# Patient Record
Sex: Female | Born: 1960 | Race: White | Hispanic: No | Marital: Single | Smoking: Former smoker
Health system: Southern US, Community
[De-identification: ages and names within clinical notes are randomized; demographics above are authoritative.]

## PROBLEM LIST (undated history)

## (undated) DIAGNOSIS — R1310 Dysphagia, unspecified: Secondary | ICD-10-CM

## (undated) DIAGNOSIS — R059 Cough, unspecified: Secondary | ICD-10-CM

## (undated) DIAGNOSIS — R0981 Nasal congestion: Secondary | ICD-10-CM

## (undated) DIAGNOSIS — R05 Cough: Secondary | ICD-10-CM

## (undated) HISTORY — DX: Cough, unspecified: R05.9

## (undated) HISTORY — PX: PARTIAL HYSTERECTOMY: SHX80

## (undated) HISTORY — DX: Nasal congestion: R09.81

## (undated) HISTORY — DX: Cough: R05

## (undated) HISTORY — DX: Dysphagia, unspecified: R13.10

---

## 1988-07-30 HISTORY — PX: AUGMENTATION MAMMAPLASTY: SUR837

## 2005-07-30 HISTORY — PX: FOOT SURGERY: SHX648

## 2006-09-13 ENCOUNTER — Ambulatory Visit: Payer: Self-pay | Admitting: Podiatry

## 2008-11-15 ENCOUNTER — Encounter: Payer: Self-pay | Admitting: Family Medicine

## 2009-05-05 ENCOUNTER — Inpatient Hospital Stay (HOSPITAL_COMMUNITY): Admission: EM | Admit: 2009-05-05 | Discharge: 2009-05-09 | Payer: Self-pay | Admitting: Emergency Medicine

## 2010-07-28 ENCOUNTER — Encounter: Payer: Self-pay | Admitting: Family Medicine

## 2010-07-28 ENCOUNTER — Ambulatory Visit
Admission: RE | Admit: 2010-07-28 | Discharge: 2010-07-28 | Payer: Self-pay | Source: Home / Self Care | Attending: Family Medicine | Admitting: Family Medicine

## 2010-07-28 DIAGNOSIS — D3910 Neoplasm of uncertain behavior of unspecified ovary: Secondary | ICD-10-CM

## 2010-07-28 DIAGNOSIS — M79609 Pain in unspecified limb: Secondary | ICD-10-CM

## 2010-07-28 DIAGNOSIS — D485 Neoplasm of uncertain behavior of skin: Secondary | ICD-10-CM | POA: Insufficient documentation

## 2010-07-28 DIAGNOSIS — H5310 Unspecified subjective visual disturbances: Secondary | ICD-10-CM | POA: Insufficient documentation

## 2010-07-28 DIAGNOSIS — F411 Generalized anxiety disorder: Secondary | ICD-10-CM | POA: Insufficient documentation

## 2010-08-01 ENCOUNTER — Telehealth: Payer: Self-pay | Admitting: Family Medicine

## 2010-08-01 ENCOUNTER — Telehealth (INDEPENDENT_AMBULATORY_CARE_PROVIDER_SITE_OTHER): Payer: Self-pay | Admitting: *Deleted

## 2010-08-01 LAB — CONVERTED CEMR LAB
ALT: 16 units/L (ref 0–35)
BUN: 15 mg/dL (ref 6–23)
CO2: 30 meq/L (ref 19–32)
Chloride: 104 meq/L (ref 96–112)
Direct LDL: 141 mg/dL
Eosinophils Relative: 3.6 % (ref 0.0–5.0)
Glucose, Bld: 74 mg/dL (ref 70–99)
HCT: 42.4 % (ref 36.0–46.0)
Lymphs Abs: 1.3 10*3/uL (ref 0.7–4.0)
MCV: 93.9 fL (ref 78.0–100.0)
Monocytes Absolute: 0.4 10*3/uL (ref 0.1–1.0)
Platelets: 236 10*3/uL (ref 150.0–400.0)
Potassium: 4.3 meq/L (ref 3.5–5.1)
RDW: 12.6 % (ref 11.5–14.6)
TSH: 1.57 microintl units/mL (ref 0.35–5.50)
Total Bilirubin: 0.9 mg/dL (ref 0.3–1.2)
WBC: 5.3 10*3/uL (ref 4.5–10.5)

## 2010-08-02 ENCOUNTER — Encounter (INDEPENDENT_AMBULATORY_CARE_PROVIDER_SITE_OTHER): Payer: Self-pay | Admitting: *Deleted

## 2010-08-02 DIAGNOSIS — J45909 Unspecified asthma, uncomplicated: Secondary | ICD-10-CM | POA: Insufficient documentation

## 2010-08-02 DIAGNOSIS — N393 Stress incontinence (female) (male): Secondary | ICD-10-CM | POA: Insufficient documentation

## 2010-08-02 DIAGNOSIS — J309 Allergic rhinitis, unspecified: Secondary | ICD-10-CM | POA: Insufficient documentation

## 2010-08-04 ENCOUNTER — Ambulatory Visit
Admission: RE | Admit: 2010-08-04 | Discharge: 2010-08-04 | Payer: Self-pay | Source: Home / Self Care | Attending: Family Medicine | Admitting: Family Medicine

## 2010-08-04 DIAGNOSIS — M7750 Other enthesopathy of unspecified foot: Secondary | ICD-10-CM | POA: Insufficient documentation

## 2010-08-04 DIAGNOSIS — M21619 Bunion of unspecified foot: Secondary | ICD-10-CM | POA: Insufficient documentation

## 2010-08-31 NOTE — Progress Notes (Signed)
Summary: lab  Phone Note Outgoing Call   Call placed by: Doristine Devoid CMA,  August 01, 2010 4:13 PM Call placed to: Patient Summary of Call: total cholesterol and LDL are both elevated.  there are 2 choices- work on diet and exercise for 6 months and recheck labs or start simvastatin 20mg  at bedtime and recheck LFTs in 6-8 weeks.  rest of labs look good!  vit d level is low.  needs to start 50,000 units weekly x12 weeks and then recheck  Follow-up for Phone Call        tried calling patient unable to leave msg voicemail box is full will try later.........Marland KitchenDoristine Devoid CMA  August 01, 2010 4:14 PM   Additional Follow-up for Phone Call Additional follow up Details #1::        Patient called in and said she would like to hold off on the simvastatin for now and focus on diet. [facility]. Additional Follow-up by: Harold Barban,  August 07, 2010 11:46 AM    New/Updated Medications: VITAMIN D (ERGOCALCIFEROL) 50000 UNIT CAPS (ERGOCALCIFEROL) take one tablet weekly x12 weeks Prescriptions: VITAMIN D (ERGOCALCIFEROL) 50000 UNIT CAPS (ERGOCALCIFEROL) take one tablet weekly x12 weeks  #12 x 0   Entered by:   Doristine Devoid CMA   Authorized by:   Neena Rhymes MD   Signed by:   Doristine Devoid CMA on 08/07/2010   Method used:   [facility]        [phone][facility] (retail)       [address]       [place], [place]  [address]       Ph: [phone]       Fax: [phone]   RxID:   [id number]

## 2010-08-31 NOTE — Letter (Signed)
Summary: Primary Care Consult Scheduled Letter  [facility] [place]  [address] [place], [place] [address]   Phone: [phone]  Fax: [phone]      08/02/2010 MRN: [id number]  [name] [address] [place], [place]  [address]    Dear Ms. [place],    We have scheduled an appointment for you.  At the recommendation of Dr. Neena Rhymes, we have scheduled you a consult with Dr. June Leap of [facility] on 08-14-2010 at 9:30am.  Their address is [address], [place] [address]. The office phone number is [phone].  If this appointment day and time is not convenient for you, please feel free to call the office of the doctor you are being referred to at the number listed above and reschedule the appointment.    It is important for you to keep your scheduled appointments. We are here to make sure you are given good patient care.   Thank you,    Renee, Patient Care Coordinator [place]

## 2010-08-31 NOTE — Progress Notes (Signed)
Summary: ophthalmology  ---- Converted from flag ---- ---- 08/01/2010 1:41 PM, Neena Rhymes MD wrote: just age related vision change.  routine eye exam  ---- 08/01/2010 1:17 PM, Magdalen Spatz [facility] wrote: Dr. Beverely Low,  Ref Ophthalmology, [facility] needs more info about the Visual Changes prior to scheduling please.  Thank you ------------------------------

## 2010-08-31 NOTE — Assessment & Plan Note (Signed)
Summary: LEFT FOOT PAIN/NP/LP   Vital Signs:  Patient profile:   50 year old female Height:      64 inches (162.56 cm) Weight:      120.6 pounds (54.82 kg) BMI:     20.78 Temp:     97.2 degrees F (36.22 degrees C) oral Pulse rate:   68 / minute BP sitting:   122 / 83  (left arm)  Vitals Entered By: Baxter Hire) (August 04, 2010 9:06 AM) CC: left foot pain Pain Assessment Patient in pain? yes     Location: left foot Intensity: 6 Nutritional Status BMI of 19 -24 = normal  Does patient need assistance? Functional Status Self care Ambulation Normal   Primary Care Provider:  Neena Rhymes MD  CC:  left foot pain.  History of Present Illness: 50 yo F here for left foot pain  Patient reports no known injury Over the past 1 year has developed worsening left plantar foot pain on ball of foot Has tried OTC medications, cushioned insoles. Has not tried MT pads Uses heat instead of ice because this makes it feel better Is on feet a lot at work on concrete floor which worsens this No prior problems with left foot or left foot surgeries (bunionectomy on right) Not physically active outside of work - no running. No prior h/o stress fractures.  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 Take One Tablet Two Times A Day As Needed  Allergies (verified): No Known Drug Allergies  Family History: + DM in brother, mother, father + heart disease in brother and mother +  HTN in brother and mother  Social History: nonsmoker, social drinker works for [employer] Status:  never  Physical Exam  General:  Well-developed,well-nourished,in no acute distress; alert,appropriate and cooperative throughout examination Msk:  L foot: Wide forefoot with mod-severe transverse arch collapse Callus prominent over 3rd MT head plantar surface. Bunion as well but no tenderness or redness Cavus foot TTP greatest at 3rd MT head No pain with tuning fork testing of 3rd MT FROM toes and ankle. NVI distally   Impression & Recommendations:  Problem # 1:  FOOT PAIN (ICD-729.5) Assessment Unchanged  2/2 metatarsalgia.  Given comforthotics with metatarsal pads.  Felt less pain at MT head with these in place.  Is not physically active or a runner and exam inconsistent with stress fracture.  No need for x-rays at this time.  No pain between MT heads to suggest mortons neuroma.  f/u in 1 month for reevaluation  Orders: Sports Insoles (Z6109)  Problem # 2:  METATARSALGIA (ICD-726.70) Assessment: New See #1 above  Orders: Sports Insoles (U0454)  Problem # 3:  BUNION (ICD-727.1)  Discussed toe spacer and bunion pad - no current pain however.  Orders: Sports Insoles [id number])  Complete Medication List: 1)  Alprazolam 1 Mg Tabs (Alprazolam) .... Take one tablet two times a day as needed   Orders Added: 1)  New Patient  Level III [99203] 2)  Sports Insoles [L3510]

## 2010-08-31 NOTE — Letter (Signed)
Summary: Sharin Grave MD  Sharin Grave MD   Imported By: Lanelle Bal 08/08/2010 12:28:16  _____________________________________________________________________  External Attachment:    Type:   Image     Comment:   External Document
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
## 2010-09-11 ENCOUNTER — Ambulatory Visit: Payer: Self-pay | Admitting: Family Medicine

## 2010-11-02 LAB — CBC
HCT: 37.4 % (ref 36.0–46.0)
Hemoglobin: 12.4 g/dL (ref 12.0–15.0)
Platelets: 203 10*3/uL (ref 150–400)
RBC: 3.87 MIL/uL (ref 3.87–5.11)
RDW: 12.9 % (ref 11.5–15.5)
WBC: 13.6 10*3/uL — ABNORMAL HIGH (ref 4.0–10.5)
WBC: 7 10*3/uL (ref 4.0–10.5)

## 2010-11-02 LAB — HEMOGLOBIN AND HEMATOCRIT, BLOOD
HCT: 35.5 % — ABNORMAL LOW (ref 36.0–46.0)
HCT: 37.9 % (ref 36.0–46.0)
HCT: 38 % (ref 36.0–46.0)
Hemoglobin: 12.9 g/dL (ref 12.0–15.0)

## 2010-11-02 LAB — BASIC METABOLIC PANEL
BUN: 5 mg/dL — ABNORMAL LOW (ref 6–23)
Chloride: 105 mEq/L (ref 96–112)
Chloride: 106 mEq/L (ref 96–112)
Creatinine, Ser: 0.65 mg/dL (ref 0.4–1.2)
Creatinine, Ser: 0.7 mg/dL (ref 0.4–1.2)
GFR calc Af Amer: >60 mL/min (ref >60)
GFR calc non Af Amer: >60 mL/min (ref >60)
Potassium: 4.2 mEq/L (ref 3.5–5.1)
Sodium: 136 mEq/L (ref 135–145)

## 2010-11-02 LAB — DIFFERENTIAL
Basophils Absolute: 0 10*3/uL (ref 0.0–0.1)
Eosinophils Absolute: 0.1 10*3/uL (ref 0.0–0.7)
Lymphocytes Relative: 7 % — ABNORMAL LOW (ref 12–46)
Monocytes Relative: 8 % (ref 3–12)
Neutro Abs: 11.4 10*3/uL — ABNORMAL HIGH (ref 1.7–7.7)
Neutrophils Relative %: 84 % — ABNORMAL HIGH (ref 43–77)

## 2010-11-02 LAB — URINE MICROSCOPIC-ADD ON

## 2010-11-02 LAB — TYPE AND SCREEN
ABO/RH(D): A NEG
Antibody Screen: NEGATIVE

## 2010-11-02 LAB — URINALYSIS, ROUTINE W REFLEX MICROSCOPIC
Bilirubin Urine: NEGATIVE
Ketones, ur: NEGATIVE mg/dL
Leukocytes, UA: NEGATIVE
Nitrite: NEGATIVE

## 2010-11-02 LAB — PREGNANCY, URINE: Preg Test, Ur: NEGATIVE

## 2010-11-20 ENCOUNTER — Encounter: Payer: Self-pay | Admitting: *Deleted

## 2010-12-22 ENCOUNTER — Telehealth: Payer: Self-pay | Admitting: Family Medicine

## 2010-12-22 MED ORDER — ALPRAZOLAM 1 MG PO TABS: ORAL_TABLET | ORAL | Status: DC

## 2010-12-22 NOTE — Telephone Encounter (Signed)
Last OV 08-04-10

## 2010-12-22 NOTE — Telephone Encounter (Signed)
rx faxed

## 2010-12-22 NOTE — Telephone Encounter (Signed)
Ok for #60, no refills 

## 2011-05-28 ENCOUNTER — Other Ambulatory Visit: Payer: Self-pay | Admitting: Family Medicine

## 2011-05-28 MED ORDER — ALPRAZOLAM 1 MG PO TABS: ORAL_TABLET | ORAL | Status: DC

## 2011-05-28 NOTE — Telephone Encounter (Signed)
Patient request refill alprazolam 1mg  - [facility] [address] - last OV 08-04-10 however it was abstracted on 11-20-10 and I do not have a [employer] login to see if they were here later??? Last refill was 12-22-10

## 2011-05-28 NOTE — Telephone Encounter (Signed)
Patient request refill alprazolam 1mg  - [facility] [address]

## 2011-05-28 NOTE — Telephone Encounter (Signed)
Ok for #30, no refills.  Please remind her to schedule CPE after 12/30.

## 2011-05-29 ENCOUNTER — Other Ambulatory Visit: Payer: Self-pay | Admitting: *Deleted

## 2011-05-29 NOTE — Telephone Encounter (Signed)
Patient called re refill - patient will check her schedule & schedule cpx

## 2011-05-29 NOTE — Telephone Encounter (Signed)
Faxed script for alprazolam manually [facility] [place] [facility] [address]

## 2011-06-19 ENCOUNTER — Telehealth: Payer: Self-pay | Admitting: Family Medicine

## 2011-06-19 MED ORDER — ALPRAZOLAM 1 MG PO TABS: ORAL_TABLET | ORAL | Status: DC

## 2011-06-19 NOTE — Telephone Encounter (Signed)
Manually [facility]ed [facility]

## 2011-06-19 NOTE — Telephone Encounter (Signed)
Refill x1 

## 2011-06-19 NOTE — Telephone Encounter (Signed)
Last OV 08-31-10 Last Refill 05-28-11

## 2011-06-19 NOTE — Telephone Encounter (Signed)
Refill alprazolam - [facility] [address]

## 2011-07-20 ENCOUNTER — Other Ambulatory Visit: Payer: Self-pay | Admitting: Family Medicine

## 2011-07-20 MED ORDER — ALPRAZOLAM 1 MG PO TABS: ORAL_TABLET | ORAL | Status: DC

## 2011-07-20 NOTE — Telephone Encounter (Signed)
Last OV 08-04-10 upcoming 08-20-11 last refill 06-19-11 #30 no refills

## 2011-07-20 NOTE — Telephone Encounter (Signed)
.  rx faxed to pharmacy, manually.  

## 2011-07-20 NOTE — Telephone Encounter (Signed)
Ok for #30, no refills 

## 2011-08-20 ENCOUNTER — Encounter: Payer: Self-pay | Admitting: Family Medicine

## 2011-08-21 ENCOUNTER — Encounter (INDEPENDENT_AMBULATORY_CARE_PROVIDER_SITE_OTHER): Payer: Self-pay | Admitting: Surgery

## 2011-08-23 ENCOUNTER — Encounter (INDEPENDENT_AMBULATORY_CARE_PROVIDER_SITE_OTHER): Payer: Self-pay | Admitting: Surgery

## 2011-08-23 ENCOUNTER — Ambulatory Visit (INDEPENDENT_AMBULATORY_CARE_PROVIDER_SITE_OTHER): Payer: 59 | Admitting: Surgery

## 2011-08-23 DIAGNOSIS — R1310 Dysphagia, unspecified: Secondary | ICD-10-CM | POA: Insufficient documentation

## 2011-08-23 NOTE — Progress Notes (Signed)
Chief Complaint  Patient presents with  . New Evaluation    Eval thyroid - referral from Julio Sicks, NP, [facility]   HISTORY: Patient is a 51 year old white female referred by her gynecologist for evaluation of dysphagia. Patient noted choking sensation frequently with meals beginning approximately 1-1/2 years ago. She has developed significant dysphagia even with small solid food. She has a constant globus sensation in the throat. She notes a chronic cough.  Patient was seen by her gynecologist. On physical exam she was felt to have possible thyroid nodules and was referred to our office for evaluation. Patient has had no prior head or neck surgery. She has no history of endocrinopathy. There is no family history of thyroid disease.  Thyroid function test were performed at the office of the gynecologist and show a normal TSH of 1.613. No other diagnostic studies were ordered.  Past Medical History  Diagnosis Date  . Trouble swallowing   . Cough   . Nasal congestion      Current Outpatient Prescriptions  Medication Sig Dispense Refill  . busPIRone (BUSPAR) 5 MG tablet Take 5 mg by mouth as needed.      . ALPRAZolam (XANAX) 1 MG tablet Two times a day as needed  30 tablet  0  . Vitamin D, Ergocalciferol, (DRISDOL) 50000 UNITS CAPS Take 50,000 Units by mouth. Weekly x 12 weeks.          No Known Allergies   Family History  Problem Relation Age of Onset  . Ulcers Father   . Gallbladder disease Mother   . Hypertension Mother   . Cancer Paternal Grandmother     lung     History   Social History  . Marital Status: Single    Spouse Name: N/A    Number of Children: N/A  . Years of Education: N/A   Social History Main Topics  . Smoking status: Former Smoker    Quit date: 08/22/1982  . Smokeless tobacco: Never Used  . Alcohol Use: Yes     occasional  . Drug Use: No  . Sexually Active: None   Other Topics Concern  . None   Social History  Narrative  . None     REVIEW OF SYSTEMS - PERTINENT POSITIVES ONLY: Constant globus sensation. Frequent dysphagia. Frequent choking sensation.  EXAM: Filed Vitals:   08/23/11 1418  BP: 110/82  Pulse: 66  Temp: 97.4 F (36.3 C)  Resp: 16    HEENT: normocephalic; pupils equal and reactive; sclerae clear; dentition good; mucous membranes moist NECK:  Mild nodularity on left; no dominant nodules; no goiter; symmetric on extension; no palpable anterior or posterior cervical lymphadenopathy; no supraclavicular masses; no tenderness CHEST: clear to auscultation bilaterally without rales, rhonchi, or wheezes CARDIAC: regular rate and rhythm without significant murmur; peripheral pulses are full ABDOMEN: soft without distension; bowel sounds present; no mass; no hepatosplenomegaly; no hernia EXT:  non-tender without edema; no deformity NEURO: no gross focal deficits; no sign of tremor   LABORATORY RESULTS: See [facility] (CHL-Epic) for most recent results   RADIOLOGY RESULTS: See [facility] (CHL-Epic) for most recent results   IMPRESSION: #1 slight nodularity thyroid gland on physical exam #2 significant dysphagia #3 globus sensation  PLAN: The patient I reviewed the above information. We discussed the possible causes of dysphagia, choking sensation, and globus sensation. Based on her exam, the thyroid is relatively normal without dominant mass. Certainly there is not a large enough mass to cause  choking or significant dysphagia. To be complete I am going to obtain a thyroid ultrasound to evaluate for nodularity felt in the left thyroid lobe. I will contact the patient with the results of that study.  As for her dysphagia and globus sensation, the most likely etiology is silent gastroesophageal reflux. In order to evaluate this further however, she may require either a swallowing study performed by speech pathology at the hospital, or she may require referral to  otolaryngology.  I would recommend Dr. Osborn Coho.  I will notify the patient and forward the results of her thyroid ultrasound.  Velora Heckler, MD, FACS General & Endocrine Surgery [facility]   Visit Diagnoses: 1. Dysphagia     Primary Care Physician: Meriel Pica, MD, MD Julio Sicks, NP

## 2011-08-23 NOTE — Patient Instructions (Signed)
Will call with ultrasound results.  tmg

## 2011-08-27 ENCOUNTER — Encounter (INDEPENDENT_AMBULATORY_CARE_PROVIDER_SITE_OTHER): Payer: Self-pay

## 2011-08-27 ENCOUNTER — Other Ambulatory Visit: Payer: Self-pay

## 2012-02-19 ENCOUNTER — Telehealth: Payer: Self-pay | Admitting: *Deleted

## 2012-02-19 NOTE — Telephone Encounter (Signed)
Received form from [employer] for a concealed Handgun permit request, MD Tabori filled out form and pt OV notes were faxed with letter as well as form original will be mailed out today. Copy placed in chart to be scanned.

## 2013-10-14 ENCOUNTER — Other Ambulatory Visit: Payer: Self-pay

## 2013-10-14 DIAGNOSIS — Z1231 Encounter for screening mammogram for malignant neoplasm of breast: Secondary | ICD-10-CM

## 2013-10-30 ENCOUNTER — Ambulatory Visit
Admission: RE | Admit: 2013-10-30 | Discharge: 2013-10-30 | Disposition: A | Discharge status: Home / Self Care | Payer: 59 | Source: Ambulatory Visit

## 2013-10-30 DIAGNOSIS — Z1231 Encounter for screening mammogram for malignant neoplasm of breast: Secondary | ICD-10-CM

## 2015-09-13 ENCOUNTER — Ambulatory Visit (INDEPENDENT_AMBULATORY_CARE_PROVIDER_SITE_OTHER): Payer: Commercial Managed Care - HMO

## 2015-09-13 ENCOUNTER — Ambulatory Visit (INDEPENDENT_AMBULATORY_CARE_PROVIDER_SITE_OTHER): Payer: Commercial Managed Care - HMO | Admitting: Family Medicine

## 2015-09-13 VITALS — BP 125/80 | HR 94 | Temp 98.9°F | Resp 16 | Ht 63.0 in | Wt 126.0 lb

## 2015-09-13 DIAGNOSIS — R5381 Other malaise: Secondary | ICD-10-CM | POA: Diagnosis not present

## 2015-09-13 DIAGNOSIS — R05 Cough: Secondary | ICD-10-CM | POA: Diagnosis not present

## 2015-09-13 DIAGNOSIS — R059 Cough, unspecified: Secondary | ICD-10-CM

## 2015-09-13 LAB — POCT CBC
GRANULOCYTE PERCENT: 68.8 % (ref 37–80)
HEMATOCRIT: 41.1 % (ref 37.7–47.9)
Hemoglobin: 13.9 g/dL (ref 12.2–16.2)
LYMPH, POC: 1.4 (ref 0.6–3.4)
MCH, POC: 29.8 pg (ref 27–31.2)
MCHC: 34 g/dL (ref 31.8–35.4)
MCV: 87.7 fL (ref 80–97)
MID (CBC): 0.5 (ref 0–0.9)
MPV: 6.3 fL (ref 0–99.8)
POC GRANULOCYTE: 4.1 (ref 2–6.9)
POC LYMPH %: 23 % (ref 10–50)
POC MID %: 8.2 %M (ref 0–12)
Platelet Count, POC: 207 10*3/uL (ref 142–424)
RBC: 4.68 M/uL (ref 4.04–5.48)
RDW, POC: 12.5 %
WBC: 5.9 10*3/uL (ref 4.6–10.2)

## 2015-09-13 MED ORDER — OSELTAMIVIR PHOSPHATE 75 MG PO CAPS: 75.0000 mg | ORAL_CAPSULE | Freq: Two times a day (BID) | ORAL | Status: DC

## 2015-09-13 MED ORDER — ACETAMINOPHEN-CODEINE #3 300-30 MG PO TABS: 1.0000 | ORAL_TABLET | Freq: Four times a day (QID) | ORAL | Status: DC | PRN

## 2015-09-13 NOTE — Patient Instructions (Addendum)
Because you received an x-ray today, you will receive an invoice from [facility]. Please contact [facility] at [phone] with questions or concerns regarding your invoice. Our billing staff will not be able to assist you with those questions.  You may have the flu Use the mucinex as needed, and the tylenol with codeine as needed for cough- remember this will make ou sleepy We will also give you tamiflu to use as directed for possible flu Please seek care if you are not getting better in the next few days- Sooner if worse.

## 2015-09-13 NOTE — Progress Notes (Signed)
[facility] [address] [place] [address] [phone]  Date:  09/13/2015   Name:  Crystal Mills   DOB:  09/29/1960   MRN:  [id number]  PCP:  Margarette Asal, MD    Chief Complaint: Sore Throat; Cough; Nausea; and Headache   History of Present Illness:  Crystal Mills is a 55 y.o. very pleasant female patient who presents with the following:  Generally healthy woman here today as a new patient with complaint of illness Today is Tuesday.  Sx seemed to start when she "got choked" on Saturday evening while eating a sandwich. Food went down the wrong way and she coughed and choked for several minutes.  By that night she had a headache and felt ill She developed nausea, and now she has a painful cough, she will cough until she dry heaves or gags.  She has not noted a fever.  She did have some body aches and chills. She notes pain in her chest with cough only  She was having some vertigo yesterday- her PCP called her in some meclizine which did help but made her feel sleepy.  Vertigo is now gone  She does not have any history of CAD.   She did smoke but quit years ago Patient Active Problem List   Diagnosis Date Noted  . Dysphagia 08/23/2011  . METATARSALGIA 08/04/2010  . BUNION 08/04/2010  . ALLERGIC RHINITIS 08/02/2010  . ASTHMA 08/02/2010  . FEMALE STRESS INCONTINENCE 08/02/2010  . 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 ANXIETY 07/28/2010  . VISUAL CHANGES 07/28/2010  . FOOT PAIN 07/28/2010    Past Medical History  Diagnosis Date  . Trouble swallowing   . Cough   . Nasal congestion     Past Surgical History  Procedure Laterality Date  . Partial hysterectomy  approx 2003  . Foot surgery  2007  . Vaginal birth after cesarean section  1981, 1991  . Augmentation mammaplasty  1990    Social History  Substance Use Topics  . Smoking status: Former Smoker    Quit date:  08/22/1982  . Smokeless tobacco: Never Used  . Alcohol Use: Yes     Comment: occasional    Family History  Problem Relation Age of Onset  . Ulcers Father   . Gallbladder disease Mother   . Hypertension Mother   . Cancer Paternal Grandmother     lung    No Known Allergies  Medication list has been reviewed and updated.  Current Outpatient Prescriptions on File Prior to Visit  Medication Sig Dispense Refill  . ALPRAZolam (XANAX) 1 MG tablet Two times a day as needed (Patient not taking: Reported on 09/13/2015) 30 tablet 0  . busPIRone (BUSPAR) 5 MG tablet Take 5 mg by mouth as needed. Reported on 09/13/2015    . Vitamin D, Ergocalciferol, (DRISDOL) 50000 UNITS CAPS Take 50,000 Units by mouth. Reported on 09/13/2015     No current facility-administered medications on file prior to visit.    Review of Systems:  As per HPI- otherwise negative.   Physical Examination: Filed Vitals:   09/13/15 1410  BP: 125/80  Pulse: 94  Temp: 98.9 F (37.2 C)  Resp: 16   Filed Vitals:   09/13/15 1410  Height: 5\' 3"  (1.6 m)  Weight: 126 lb (57.153 kg)   Body mass index is 22.33 kg/(m^2). Ideal Body Weight: Weight in (lb) to have BMI =  25: 140.8  GEN: WDWN, NAD, Non-toxic, A & O x 3, slim build.    HEENT: Atraumatic, Normocephalic. Neck supple. No masses, No LAD.  Bilateral TM wnl, oropharynx normal.  PEERL,EOMI.   Ears and Nose: No external deformity. CV: RRR, No M/G/R. No JVD. No thrill. No extra heart sounds.  Able to reproduce CP by pressing on chest wall PULM: CTA B, no wheezes, crackles, rhonchi. No retractions. No resp. distress. No accessory muscle use. ABD: S, NT, ND EXTR: No c/c/e NEURO Normal gait.  PSYCH: Normally interactive. Conversant. Not depressed or anxious appearing.  Calm demeanor.   Results for orders placed or performed in visit on 09/13/15  POCT CBC  Result Value Ref Range   WBC 5.9 4.6 - 10.2 K/uL   Lymph, poc 1.4 0.6 - 3.4   POC LYMPH PERCENT 23.0 10 -  50 %L   MID (cbc) 0.5 0 - 0.9   POC MID % 8.2 0 - 12 %M   POC Granulocyte 4.1 2 - 6.9   Granulocyte percent 68.8 37 - 80 %G   RBC 4.68 4.04 - 5.48 M/uL   Hemoglobin 13.9 12.2 - 16.2 g/dL   HCT, POC 41.1 37.7 - 47.9 %   MCV 87.7 80 - 97 fL   MCH, POC 29.8 27 - 31.2 pg   MCHC 34.0 31.8 - 35.4 g/dL   RDW, POC 12.5 %   Platelet Count, POC 207 142 - 424 K/uL   MPV 6.3 0 - 99.8 fL    [facility] reading (PRIMARY) by  Dr. Lorelei Pont. CXR: possible right sided infiltrate  Dg Chest 2 View  09/13/2015  CLINICAL DATA:  Cough, painful cough EXAM: CHEST  2 VIEW COMPARISON:  05/05/2009 FINDINGS: The heart size and mediastinal contours are within normal limits. Both lungs are clear. The visualized skeletal structures are unremarkable. IMPRESSION: No active cardiopulmonary disease. Electronically Signed   By: Kathreen Devoid   On: 09/13/2015 14:43    Assessment and Plan:. Cough - Plan: POCT CBC, DG Chest 2 View, oseltamivir (TAMIFLU) 75 MG capsule, acetaminophen-codeine (TYLENOL #3) 300-30 MG tablet  Malaise - Plan: POCT CBC, DG Chest 2 View  Her CXR is negative and CBC reassuring so she does not appear to have an aspiration pneumonia.  Suspect the chocking incident may be unrelated and that she most likely has the flu as we are in the midst of a large flu outbreak.   Will start her on tamiflu and also gave rx for tylenol #3 for cough  Signed Lamar Blinks, MD

## 2015-09-19 ENCOUNTER — Telehealth: Payer: Self-pay

## 2015-09-19 NOTE — Telephone Encounter (Signed)
Patient needs FMLA forms completed for when she missed work on 09/13/15 through 09/17/15 do to the flu. Dr Lorelei Pont was her doctor so I am going to place these forms in Dr. Thompson Caul box on 09/19/15 not sure who is taking over her cases. I have filled out the paperwork it just needs a signature and date. Please return to the FMLA/Disability box at the 102 check out desk within 5-7 business days. Thanks!

## 2015-09-23 DIAGNOSIS — Z0271 Encounter for disability determination: Secondary | ICD-10-CM

## 2015-09-26 NOTE — Telephone Encounter (Signed)
Have we completed these forms yet? Today 09/16/15 is the 5th day and patient called in to check the status. Please let me know if you will need more time. Thanks!

## 2015-09-30 NOTE — Telephone Encounter (Signed)
i have not seen these forms and I did not see them in your box have they been completed? If so was I not given a copy?

## 2015-10-03 NOTE — Telephone Encounter (Signed)
Forms scanned and patient called to come pick up on 10/03/15

## 2016-06-15 ENCOUNTER — Emergency Department (HOSPITAL_COMMUNITY): Payer: Worker's Compensation

## 2016-06-15 ENCOUNTER — Emergency Department (HOSPITAL_COMMUNITY)
Admission: EM | Admit: 2016-06-15 | Discharge: 2016-06-15 | Disposition: A | Discharge status: Home / Self Care | Payer: Worker's Compensation | Attending: Emergency Medicine | Admitting: Emergency Medicine

## 2016-06-15 ENCOUNTER — Encounter (HOSPITAL_COMMUNITY): Payer: Self-pay

## 2016-06-15 DIAGNOSIS — W1789XA Other fall from one level to another, initial encounter: Secondary | ICD-10-CM | POA: Insufficient documentation

## 2016-06-15 DIAGNOSIS — S22058A Other fracture of T5-T6 vertebra, initial encounter for closed fracture: Secondary | ICD-10-CM | POA: Diagnosis not present

## 2016-06-15 DIAGNOSIS — Z85828 Personal history of other malignant neoplasm of skin: Secondary | ICD-10-CM | POA: Insufficient documentation

## 2016-06-15 DIAGNOSIS — S22000A Wedge compression fracture of unspecified thoracic vertebra, initial encounter for closed fracture: Secondary | ICD-10-CM

## 2016-06-15 DIAGNOSIS — Z87891 Personal history of nicotine dependence: Secondary | ICD-10-CM | POA: Insufficient documentation

## 2016-06-15 DIAGNOSIS — W19XXXA Unspecified fall, initial encounter: Secondary | ICD-10-CM

## 2016-06-15 DIAGNOSIS — Y990 Civilian activity done for income or pay: Secondary | ICD-10-CM | POA: Diagnosis not present

## 2016-06-15 DIAGNOSIS — Y929 Unspecified place or not applicable: Secondary | ICD-10-CM | POA: Insufficient documentation

## 2016-06-15 DIAGNOSIS — Y9389 Activity, other specified: Secondary | ICD-10-CM | POA: Insufficient documentation

## 2016-06-15 DIAGNOSIS — Z8543 Personal history of malignant neoplasm of ovary: Secondary | ICD-10-CM | POA: Insufficient documentation

## 2016-06-15 DIAGNOSIS — S299XXA Unspecified injury of thorax, initial encounter: Secondary | ICD-10-CM | POA: Diagnosis present

## 2016-06-15 DIAGNOSIS — J45909 Unspecified asthma, uncomplicated: Secondary | ICD-10-CM | POA: Insufficient documentation

## 2016-06-15 LAB — I-STAT BETA HCG BLOOD, ED (MC, WL, AP ONLY): I-stat hCG, quantitative: <5 m[IU]/mL (ref <5)

## 2016-06-15 MED ORDER — METHOCARBAMOL 500 MG PO TABS: 500.0000 mg | ORAL_TABLET | Freq: Two times a day (BID) | ORAL | 0 refills | Status: DC | PRN

## 2016-06-15 MED ORDER — MELOXICAM 7.5 MG PO TABS: 7.5000 mg | ORAL_TABLET | Freq: Every day | ORAL | 0 refills | Status: DC

## 2016-06-15 MED ORDER — TRAMADOL HCL 50 MG PO TABS: 50.0000 mg | ORAL_TABLET | Freq: Four times a day (QID) | ORAL | 0 refills | Status: DC | PRN

## 2016-06-15 MED ORDER — ONDANSETRON HCL 4 MG/2ML IJ SOLN
4.0000 mg | Freq: Once | INTRAMUSCULAR | Status: AC
  Administered 2016-06-15: 4 mg via INTRAVENOUS
  Filled 2016-06-15: qty 2

## 2016-06-15 MED ORDER — HYDROMORPHONE HCL 2 MG/ML IJ SOLN
0.5000 mg | Freq: Once | INTRAMUSCULAR | Status: AC | PRN
  Administered 2016-06-15: 0.5 mg via INTRAVENOUS
  Filled 2016-06-15: qty 1

## 2016-06-15 MED ORDER — HYDROMORPHONE HCL 2 MG/ML IJ SOLN
0.5000 mg | Freq: Once | INTRAMUSCULAR | Status: AC
  Administered 2016-06-15: 0.5 mg via INTRAVENOUS
  Filled 2016-06-15: qty 1

## 2016-06-15 MED ORDER — SODIUM CHLORIDE 0.9 % IV BOLUS (SEPSIS): 1000.0000 mL | Freq: Once | INTRAVENOUS | Status: DC

## 2016-06-15 MED ORDER — ONDANSETRON 4 MG PO TBDP
8.0000 mg | ORAL_TABLET | Freq: Once | ORAL | Status: AC
  Administered 2016-06-15: 8 mg via ORAL
  Filled 2016-06-15: qty 2

## 2016-06-15 MED ORDER — SODIUM CHLORIDE 0.9 % IV BOLUS (SEPSIS)
500.0000 mL | Freq: Once | INTRAVENOUS | Status: AC
  Administered 2016-06-15: 500 mL via INTRAVENOUS

## 2016-06-15 NOTE — ED Notes (Signed)
Bio tech representative at bedside placing patient on back brace.

## 2016-06-15 NOTE — ED Notes (Signed)
EDP at bedside  

## 2016-06-15 NOTE — ED Provider Notes (Signed)
5:00PM: Assumed care from Margarita Mail, PA-C at sign out  Crystal Mills is a 55 y.o. female presents to ED with complaint of mid-back pain s/p fall 31ft ladder. No head trauma. No  LOC. No anticoagulation therapy. T5 compression fracture. Dr. Cyndy Freeze consulted by PA Harris, recommend jewett hyperextension brace. At sign out brace application pending. Plan d/c with pain medication and OP follow up.   6:41 PM: Jewett Hyperextension brace placed. Patient pain level 4/10.  Blood pressure 129/85, pulse 78, temperature 98.5 F (36.9 C), temperature source Oral, resp. rate 18, height 5\' 4"  (1.626 m), weight 59 kg, SpO2 100 %. Episode of emesis - most likely secondary to doses of dilaudid with minimal intake. ODT zofran given.  Heart RRR. Lungs CTABL. Intact distal pulses. +BS, abdomen soft.  Discussed results and plan with patient. Rx robaxin, ultram, and mobic. Review of [place] controlled substance database shows no recent narcotic rx. Follow up with Dr. Cyndy Freeze, contact information provided. Return precautions provided. Pt voiced understanding and is agreeable.    Roxanna Mew, PA-C 06/15/16 [place], MD 06/16/16 1341

## 2016-06-15 NOTE — ED Notes (Signed)
Pt. Refusing bedpan. Pt. Wheeled in wheelchair to restroom.

## 2016-06-15 NOTE — ED Notes (Signed)
Pt. Very nauseous as she sat up. EDP made aware.

## 2016-06-15 NOTE — Progress Notes (Signed)
Orthopedic Tech Progress Note Patient Details:  Crystal Mills 1961/01/26 [id number] Brace completed by bio-tech  Patient ID: [name], female   DOB: 1961-06-05, 55 y.o.   MRN: [id number]   Braulio Bosch 06/15/2016, 5:24 PM

## 2016-06-15 NOTE — Progress Notes (Signed)
Orthopedic Tech Progress Note Patient Details:  Crystal Mills [birth year] [id number] Called bio-tech for brace order Patient ID: [name], female   DOB: 12-06-1960, 55 y.o.   MRN: [id number]   Braulio Bosch 06/15/2016, 4:30 PM

## 2016-06-15 NOTE — Discharge Instructions (Addendum)
Read the information below.  You are being prescribed mobic for mild to moderate pain. While taking, do not take other NSAIDs (ibuprofen, motrin, or aleve). I have also prescribed robaxin, a muscle relaxer, this can make you drowsy, do not drive after taking. Lastly, I have prescribed tramadol for severe pain, this can also make you drowsy, do not drive after taking.  Wear brace until seen by Dr. Cyndy Freeze. Activity as tolerated.  Please call to schedule a follow up appointment with Dr. Cyndy Freeze, contact information provided.  Use the prescribed medication as directed.  Please discuss all new medications with your pharmacist.   You may return to the Emergency Department at any time for worsening condition or any new symptoms that concern you. Return to ED if develop fever, shortness of breath, cough, numbness, weakness, loss of bowel or bladder control, or any other new/concerning symptoms.

## 2016-06-15 NOTE — ED Triage Notes (Signed)
Pt. Coming from work via [employer] for fall off ladder today. Pt. 3/4 way up ladder when it slid down with her on it. Pt. Landed in sitting position. Pt. C/o back pain up to her thoracic area. Pt. Aox4. Pt. Denies hitting her head or LOC. Pt. Ambulatory on scene.

## 2016-06-15 NOTE — ED Notes (Signed)
Contacted ortho tech about brace at this time.

## 2016-06-15 NOTE — ED Provider Notes (Signed)
[facility] DEPT Provider Note   CSN: [id number] Arrival date & time: 06/15/16  1044     History   Chief Complaint Chief Complaint  Patient presents with  . Back Pain    HPI Crystal Mills is a 55 y.o. female who presents emergency Department with chief complaint of fall and back pain. Patient was at work today when she fell off of a 6 foot, lower bladder onto the floor in a seated position directly onto her bottom. Her right leg was tangled in the bladder and she got a small abrasion. She is up-to-date on her tetanus vaccination. She complains of severe mid back pain with bilateral radiation around the ribs. She denies neck pain, back low back or tailbone pain. She is ambulatory prior to arrival. She has pain with deep breathing, but denies any numbness, tingling or weakness in the upper and lower extremities. She did not hit her head or lose consciousness.  HPI  Past Medical History:  Diagnosis Date  . Cough   . Nasal congestion   . Trouble swallowing     Patient Active Problem List   Diagnosis Date Noted  . Dysphagia 08/23/2011  . METATARSALGIA 08/04/2010  . BUNION 08/04/2010  . ALLERGIC RHINITIS 08/02/2010  . ASTHMA 08/02/2010  . FEMALE STRESS INCONTINENCE 08/02/2010  . 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 ANXIETY 07/28/2010  . VISUAL CHANGES 07/28/2010  . FOOT PAIN 07/28/2010    Past Surgical History:  Procedure Laterality Date  . AUGMENTATION MAMMAPLASTY  1990  . FOOT SURGERY  2007  . PARTIAL HYSTERECTOMY  approx 2003  . VAGINAL BIRTH AFTER CESAREAN SECTION  1981, 1991    OB History    No data available       Home Medications    Prior to Admission medications   Medication Sig Start Date End Date Taking? Authorizing Provider  acetaminophen-codeine (TYLENOL #3) 300-30 MG tablet Take 1 tablet by mouth every 6 (six) hours as needed (cough or pain). 09/13/15   Gay Filler Copland, MD    busPIRone (BUSPAR) 5 MG tablet Take 5 mg by mouth as needed. Reported on 09/13/2015    Historical Provider, MD  oseltamivir (TAMIFLU) 75 MG capsule Take 1 capsule (75 mg total) by mouth 2 (two) times daily. 09/13/15   Darreld Mclean, MD  Vitamin D, Ergocalciferol, (DRISDOL) 50000 UNITS CAPS Take 50,000 Units by mouth. Reported on 09/13/2015    Historical Provider, MD    Family History Family History  Problem Relation Age of Onset  . Ulcers Father   . Gallbladder disease Mother   . Hypertension Mother   . Cancer Paternal Grandmother     lung    Social History Social History  Substance Use Topics  . Smoking status: Former Smoker    Quit date: 08/22/1982  . Smokeless tobacco: Never Used  . Alcohol use Yes     Comment: occasional     Allergies   Patient has no known allergies.   Review of Systems Review of Systems  Ten systems reviewed and are negative for acute change, except as noted in the HPI.   Physical Exam Updated Vital Signs BP 142/75   Pulse 76   Temp 98.2 F (36.8 C) (Oral)   Resp 17   Ht 5\' 4"  (1.626 m)   Wt 59 kg   SpO2 98%   BMI 22.31 kg/m   Physical Exam  Constitutional: She is oriented  to person, place, and time. She appears well-developed and well-nourished. No distress.  HENT:  Head: Normocephalic and atraumatic.  Eyes: Conjunctivae are normal. No scleral icterus.  Neck: Normal range of motion.  Cardiovascular: Normal rate, regular rhythm and normal heart sounds.  Exam reveals no gallop and no friction rub.   No murmur heard. Pulmonary/Chest: Effort normal and breath sounds normal. No respiratory distress.  Abdominal: Soft. Bowel sounds are normal. She exhibits no distension and no mass. There is no tenderness. There is no guarding.  Musculoskeletal:       Thoracic back: She exhibits bony tenderness.       Back:  Neurological: She is alert and oriented to person, place, and time.  Skin: Skin is warm and dry. She is not diaphoretic.   Nursing note and vitals reviewed.    ED Treatments / Results  Labs (all labs ordered are listed, but only abnormal results are displayed) Labs Reviewed - No data to display  EKG  EKG Interpretation None       Radiology No results found.  Procedures Procedures (including critical care time)  Medications Ordered in ED Medications  sodium chloride 0.9 % bolus 1,000 mL (not administered)  ondansetron (ZOFRAN) injection 4 mg (not administered)     Initial Impression / Assessment and Plan / ED Course  I have reviewed the triage vital signs and the nursing notes.  Pertinent labs & imaging results that were available during my care of the patient were reviewed by me and considered in my medical decision making (see chart for details).  Clinical Course as of Jun 15 1620  Fri Jun 15, 2016  1424 DG Chest 2 View [AH]  1424 negative DG Lumbar Spine Complete [AH]  1425 Acute T5 and potential T4 fracture consistent wit physical exam findings. DG Thoracic Spine W/Swimmers [AH]  1531 I-stat hCG, quantitative: <5.0 [JM]  [id number] Patient's CT returned. I spoke with Dr. Cyndy Freeze recommending  a jewett hyperextension brace, for a week. Follow up with him, tramadol, naproxen and Robaxin at discharge. I have left the patient in sign out with PA [facility]. Patient is comfortable during her visit.  [AH]    Clinical Course User Index [AH] Margarita Mail, PA-C [JM] Emeline General, PA      Final Clinical Impressions(s) / ED Diagnoses   Final diagnoses:  Closed compression fracture of thoracic vertebra, initial encounter [facility])    New Prescriptions New Prescriptions   No medications on file     Margarita Mail, PA-C 06/15/16 1624    Blanchie Dessert, MD 06/16/16 [phone]

## 2016-07-03 ENCOUNTER — Other Ambulatory Visit: Payer: Self-pay | Admitting: Obstetrics & Gynecology

## 2016-07-04 LAB — CYTOLOGY - PAP

## 2020-07-25 ENCOUNTER — Encounter: Payer: Self-pay | Admitting: Podiatry

## 2020-07-25 ENCOUNTER — Ambulatory Visit: Payer: Commercial Managed Care - HMO | Admitting: Podiatry

## 2020-07-25 ENCOUNTER — Other Ambulatory Visit: Payer: Self-pay

## 2020-07-25 DIAGNOSIS — L603 Nail dystrophy: Secondary | ICD-10-CM | POA: Diagnosis not present

## 2020-07-28 NOTE — Progress Notes (Signed)
Subjective:   Patient ID: Crystal Mills, female   DOB: 58 y.o.   MRN: [id number]   HPI 59 year old female presents the office today for concerns of right big toenail discoloration.  She is concerned about fungus but also she was reading online that the nails become brittle and cracked that it could be melanoma and she wants to have the nails checked.  Currently denies any pain the nails denies any redness or drainage or any swelling.  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   Review of Systems  All other systems reviewed and are negative.  Past Medical History:  Diagnosis Date  . Cough   . Nasal congestion   . Trouble swallowing     Past Surgical History:  Procedure Laterality Date  . AUGMENTATION MAMMAPLASTY  1990  . FOOT SURGERY  2007  . PARTIAL HYSTERECTOMY  approx 2003  . VAGINAL BIRTH AFTER CESAREAN SECTION  1981, 1991     Current Outpatient Medications:  .  acetaminophen-codeine (TYLENOL #3) 300-30 MG tablet, Take 1 tablet by mouth every 6 (six) hours as needed (cough or pain). (Patient not taking: Reported on 06/15/2016), Disp: 30 tablet, Rfl: 0 .  meloxicam (MOBIC) 7.5 MG tablet, Take 1 tablet (7.5 mg total) by mouth daily., Disp: 30 tablet, Rfl: 0 .  methocarbamol (ROBAXIN) 500 MG tablet, Take 1 tablet (500 mg total) by mouth 2 (two) times daily as needed for muscle spasms., Disp: 20 tablet, Rfl: 0 .  oseltamivir (TAMIFLU) 75 MG capsule, Take 1 capsule (75 mg total) by mouth 2 (two) times daily. (Patient not taking: Reported on 06/15/2016), Disp: 10 capsule, Rfl: 0 .  traMADol (ULTRAM) 50 MG tablet, Take 1 tablet (50 mg total) by mouth every 6 (six) hours as needed., Disp: 15 tablet, Rfl: 0  No Known Allergies       Objective:  Physical Exam  General: AAO x3, NAD  Dermatological: Hallux nails are hypertrophic, dystrophic with yellow to brown discoloration there is a vertical splitting within the toenail centrally at the  distal one third of the nail.  There is no hyperpigmentation to the toenail there is no hyperpigmentation to the surrounding skin.  There are no open sores, no preulcerative lesions, no rash or signs of infection present.  Vascular: Dorsalis Pedis artery and Posterior Tibial artery pedal pulses are 2/4 bilateral with immedate capillary fill time.There is no pain with calf compression, swelling, warmth, erythema.   Neruologic: Grossly intact via light touch bilateral.   Musculoskeletal: No gross boney pedal deformities bilateral. No pain, crepitus, or limitation noted with foot and ankle range of motion bilateral. Muscular strength 5/5 in all groups tested bilateral.  Gait: Unassisted, Nonantalgic.       Assessment:   Toenail onychomycosis, onychodystrophy     Plan:  -Treatment options discussed including all alternatives, risks, and complications -Etiology of symptoms were discussed -Does not appear to have any characteristics of melanoma and more likely from onychomycosis.  I did debride the nails and sent this for culture, pathology to [facility] labs for evaluation of onychomycosis.  -I do think the combination of her shoes and fungus is causing dystrophy, fungus.  Vivi Barrack DPM

## 2020-08-16 ENCOUNTER — Telehealth: Payer: Self-pay | Admitting: Podiatry

## 2020-08-16 NOTE — Telephone Encounter (Signed)
Patient called about her test results. Janett Billow is scanning them into her chart now.

## 2020-08-16 NOTE — Telephone Encounter (Signed)
I called the patient back. Culture shoes fungus and damage. Lenward Chancellor- [place] negative. Discussed options for the nails and she has elected to do topical. Ordered a compound through [place] and I have faxed it to the pharmacy.

## 2021-07-25 ENCOUNTER — Telehealth: Payer: Self-pay | Admitting: Podiatry

## 2021-07-25 NOTE — Telephone Encounter (Signed)
Patient wanted to know if she is a candidate get the laser since the other treatment options are not working, She stated the nail has grown back but it still looks really bad/

## 2021-08-10 ENCOUNTER — Other Ambulatory Visit: Payer: Self-pay | Admitting: Obstetrics & Gynecology

## 2021-08-10 DIAGNOSIS — Z1231 Encounter for screening mammogram for malignant neoplasm of breast: Secondary | ICD-10-CM

## 2021-09-25 ENCOUNTER — Ambulatory Visit
Admission: RE | Admit: 2021-09-25 | Discharge: 2021-09-25 | Disposition: A | Discharge status: Home / Self Care | Payer: 59 | Source: Ambulatory Visit | Attending: Obstetrics & Gynecology | Admitting: Obstetrics & Gynecology

## 2021-09-25 DIAGNOSIS — Z1231 Encounter for screening mammogram for malignant neoplasm of breast: Secondary | ICD-10-CM

## 2021-11-22 ENCOUNTER — Ambulatory Visit (INDEPENDENT_AMBULATORY_CARE_PROVIDER_SITE_OTHER): Payer: 59

## 2021-11-22 ENCOUNTER — Ambulatory Visit
Admission: EM | Admit: 2021-11-22 | Discharge: 2021-11-22 | Disposition: A | Discharge status: Home / Self Care | Payer: 59

## 2021-11-22 DIAGNOSIS — G8929 Other chronic pain: Secondary | ICD-10-CM

## 2021-11-22 DIAGNOSIS — M546 Pain in thoracic spine: Secondary | ICD-10-CM | POA: Diagnosis not present

## 2021-11-22 DIAGNOSIS — R000 Tachycardia, unspecified: Secondary | ICD-10-CM | POA: Diagnosis not present

## 2021-11-22 DIAGNOSIS — R110 Nausea: Secondary | ICD-10-CM | POA: Diagnosis not present

## 2021-11-22 MED ORDER — BACLOFEN 10 MG PO TABS: 10.0000 mg | ORAL_TABLET | Freq: Three times a day (TID) | ORAL | 0 refills | Status: AC

## 2021-11-22 MED ORDER — DICLOFENAC SODIUM 1 % EX GEL: 4.0000 g | Freq: Four times a day (QID) | CUTANEOUS | 2 refills | Status: DC

## 2021-11-22 MED ORDER — KETOROLAC TROMETHAMINE 60 MG/2ML IM SOLN
60.0000 mg | Freq: Once | INTRAMUSCULAR | Status: AC
  Administered 2021-11-22: 60 mg via INTRAMUSCULAR

## 2021-11-22 MED ORDER — DICLOFENAC SODIUM 75 MG PO TBEC: 75.0000 mg | DELAYED_RELEASE_TABLET | Freq: Two times a day (BID) | ORAL | 1 refills | Status: AC

## 2021-11-22 MED ORDER — ONDANSETRON 8 MG PO TBDP
8.0000 mg | ORAL_TABLET | Freq: Once | ORAL | Status: AC
  Administered 2021-11-22: 8 mg via ORAL

## 2021-11-22 NOTE — ED Triage Notes (Signed)
Patient states a few years ago she obtained a fracture for her T4. She reports since last Thursday she has been having pain to the area, it radiates up to the nape region, and she has been dealing with nausea. Patient denies vision changes, headache or light sensitivity.  ?

## 2021-11-22 NOTE — ED Notes (Signed)
EKG results given to provider.  

## 2021-11-22 NOTE — Discharge Instructions (Addendum)
The radiology report of your x-ray of your thoracic spine is still pending.  Per my personal read, it appears that you have some disc compression a little further down your spine between T7 - T8, T8 - T9.  T4 looks like it ha healed well but there is also some loss of disc space between T3 and T4.  I think this is definitely causing her pain at this time. ? ?The mainstay of therapy for musculoskeletal pain is reduction of inflammation and relaxation of tension which is causing inflammation.  Keep in mind, pain always begets more pain.  To help you stay ahead of your pain and inflammation, I have provided the following regimen for you: ?  ?During your visit today, you received an injection of ketorolac, high-dose nonsteroidal anti-inflammatory pain medication that should significantly reduce your pain for the next 6 to 8 hours. ?   ?This evening, you can begin taking baclofen 10 mg.  This is a highly effective muscle relaxer and antispasmodic which should continue to provide you with relaxation of your tense muscles, allow you to sleep well and to keep your pain under control.  You can continue taking this medication 3 times daily as you need to.  If you find that this medication makes you too sleepy, you can break them in half for your daytime doses and, if needed double them for your nighttime dose.  Do not take more than 30 mg of baclofen in a 24-hour period. ?  ?During the day, please set aside time to apply ice to the affected area 4 times daily for 20 minutes each application.  This can be achieved by using a bag of frozen peas or corn, a Ziploc bag filled with ice and water, or Ziploc bag filled with half rubbing alcohol and half Dawn dish detergent, frozen into a slush.  Please be careful not to apply ice directly to your skin, always place a soft cloth between you and the ice pack. ?  ?You are welcome to use topical anti-inflammatory creams such as Voltaren gel, capsaicin or Aspercreme as recommended.  These  medications are available over-the-counter, please follow manufactures instructions for use.  As a courtesy, I provided you with a prescription for diclofenac in the event that your insurance will pay for this. ?  ?Please consider discussing referral to physical therapy with your primary care provider.  Physical therapist are very good at teasing out the underlying cause of acute lower back pain and helping with prevention of future recurrences. ?  ?Please avoid attempts to stretch or strengthen the affected area until you are feeling completely pain-free.  Attempts to do so will only prolong the healing process. ?  ?If you would like to try to return return to urgent care in the next 2 to 3 days for repeat ketorolac injection, you are welcome to do so. ?  ?I also recommend that you remain out of work for the next several days, I provided you with a note to return to work in 3 days.  If you feel that you need this time extended, please follow-up with your primary care provider or return to urgent care for reevaluation so that we can provide you with a note for another 3 days. ?  ?Thank you for visiting urgent care today.  We appreciate the opportunity to participate in your care. ? ?

## 2021-11-23 NOTE — ED Provider Notes (Signed)
?[facility] ? ? ? ?CSN: [id number] ?Arrival date & time: 11/22/21  1103 ?  ? ?HISTORY  ? ?Chief Complaint  ?Patient presents with  ? Back Pain  ? ?HPI ?Crystal Mills is a 61 y.o. female. ThisPatient states a few years ago she obtained a fracture for her T4. She reports since last Thursday she has been having pain to the area, it radiates up to the nape region, and she has been dealing with nausea. Patient denies vision changes, headache or light sensitivity.  Patient states that she has followed up with her neurologist a few months ago who tells her that everything is fine and she is healed well.  Patient states she ran out of anti-inflammatory pain medication and is requesting a refill at this time. ? ?The history is provided by the patient.  ?Past Medical History:  ?Diagnosis Date  ? Cough   ? Nasal congestion   ? Trouble swallowing   ? ?Patient Active Problem List  ? Diagnosis Date Noted  ? Dysphagia 08/23/2011  ? METATARSALGIA 08/04/2010  ? BUNION 08/04/2010  ? ALLERGIC RHINITIS 08/02/2010  ? ASTHMA 08/02/2010  ? FEMALE STRESS INCONTINENCE 08/02/2010  ? 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 ANXIETY 07/28/2010  ? VISUAL CHANGES 07/28/2010  ? FOOT PAIN 07/28/2010  ? ?Past Surgical History:  ?Procedure Laterality Date  ? [place]  ? FOOT SURGERY  2007  ? PARTIAL HYSTERECTOMY  approx 2003  ? VAGINAL BIRTH AFTER CESAREAN SECTION  1981, 1991  ? ?OB History   ?No obstetric history on file. ?  ? ?Home Medications   ? ?Prior to Admission medications   ?Medication Sig Start Date End Date Taking? Authorizing Provider  ?baclofen (LIORESAL) 10 MG tablet Take 1 tablet (10 mg total) by mouth 3 (three) times daily for 7 days. 11/22/21 11/29/21 Yes Lynden Oxford Scales, PA-C  ?diclofenac (VOLTAREN) 75 MG EC tablet Take 1 tablet (75 mg total) by mouth 2 (two) times daily. 11/22/21 01/21/22 Yes Lynden Oxford Scales, PA-C   ?diclofenac Sodium (VOLTAREN) 1 % GEL Apply 4 g topically 4 (four) times daily. Apply to affected areas 4 times daily as needed for pain. 11/22/21  Yes Lynden Oxford Scales, PA-C  ?brimonidine-timolol (COMBIGAN) 0.2-0.5 % ophthalmic solution brimonidine 0.2 %-timolol 0.5 % eye drops ? INSTILL 1 DROP IN BOTH EYES TWICE DAILY    [provider]  ?rosuvastatin (CRESTOR) 10 MG tablet rosuvastatin 10 mg tablet ? TAKE 1 TABLET BY MOUTH AT BEDTIME FOR CHOLESTEROL    [provider]  ?traMADol (ULTRAM) 50 MG tablet Take 1 tablet (50 mg total) by mouth every 6 (six) hours as needed. 06/15/16   Frederica Kuster, PA-C  ? ? ?Family History ?Family History  ?Problem Relation Age of Onset  ? Ulcers Father   ? Gallbladder disease Mother   ? Hypertension Mother   ? Cancer Paternal Grandmother   ?     lung  ? ?Social History ?Social History  ? ?Tobacco Use  ? Smoking status: Former  ?  Types: Cigarettes  ?  Quit date: 08/22/1982  ?  Years since quitting: 39.2  ? Smokeless tobacco: Never  ?Substance Use Topics  ? Alcohol use: Yes  ?  Comment: occasional  ? Drug use: No  ? ?Allergies   ?Patient has no known allergies. ? ?Review of Systems ?Review of Systems ?Pertinent findings noted in history of present illness.  ? ?  Physical Exam ?Triage Vital Signs ?ED Triage Vitals  ?Enc Vitals Group  ?   BP 05/26/21 0827 (!) 147/82  ?   Pulse Rate 05/26/21 0827 72  ?   Resp 05/26/21 0827 18  ?   Temp 05/26/21 0827 98.3 ?F (36.8 ?C)  ?   Temp Source 05/26/21 0827 Oral  ?   SpO2 05/26/21 0827 98 %  ?   Weight --   ?   Height --   ?   Head Circumference --   ?   Peak Flow --   ?   Pain Score 05/26/21 0826 5  ?   Pain Loc --   ?   Pain Edu? --   ?   Excl. in [place]? --   ?No data found. ? ?Updated Vital Signs ?BP 119/78 (BP Location: Right Arm)   Pulse (!) 137 Comment: provider aware  Temp 100.1 ?F (37.8 ?C) (Oral)   Resp 20   SpO2 95%  ? ?Physical Exam ?Vitals and nursing note reviewed.  ?Constitutional:   ?   General: She is not in  acute distress. ?   Appearance: Normal appearance. She is not ill-appearing.  ?HENT:  ?   Head: Normocephalic and atraumatic.  ?Eyes:  ?   General: Lids are normal.     ?   Right eye: No discharge.     ?   Left eye: No discharge.  ?   Extraocular Movements: Extraocular movements intact.  ?   Conjunctiva/sclera: Conjunctivae normal.  ?   Right eye: Right conjunctiva is not injected.  ?   Left eye: Left conjunctiva is not injected.  ?Neck:  ?   Trachea: Trachea and phonation normal.  ?Cardiovascular:  ?   Rate and Rhythm: Normal rate and regular rhythm.  ?   Pulses: Normal pulses.  ?   Heart sounds: Normal heart sounds. No murmur heard. ?  No friction rub. No gallop.  ?Pulmonary:  ?   Effort: Pulmonary effort is normal. No accessory muscle usage, prolonged expiration or respiratory distress.  ?   Breath sounds: Normal breath sounds. No stridor, decreased air movement or transmitted upper airway sounds. No decreased breath sounds, wheezing, rhonchi or rales.  ?Chest:  ?   Chest wall: No tenderness.  ?Musculoskeletal:     ?   General: Tenderness (TTP multiple areas of thoracic spine, step-off appreciated at T6, T7 and T8.) present. Normal range of motion.  ?   Cervical back: Normal range of motion and neck supple. Normal range of motion.  ?Lymphadenopathy:  ?   Cervical: No cervical adenopathy.  ?Skin: ?   General: Skin is warm and dry.  ?   Findings: No erythema or rash.  ?Neurological:  ?   General: No focal deficit present.  ?   Mental Status: She is alert and oriented to person, place, and time.  ?Psychiatric:     ?   Mood and Affect: Mood normal.     ?   Behavior: Behavior normal.  ? ? ?Visual Acuity ?Right Eye Distance:   ?Left Eye Distance:   ?Bilateral Distance:   ? ?Right Eye Near:   ?Left Eye Near:    ?Bilateral Near:    ? ?UC Couse / Diagnostics / Procedures:  ?  ?EKG ? ?Radiology ?DG Thoracic Spine 2 View ? ?Result Date: 11/22/2021 ?CLINICAL DATA:  Remote T5 compression fracture, upper thoracic pain. EXAM:  THORACIC SPINE 2 VIEWS COMPARISON:  Multiple exams, including 06/15/2016 and radiographs from  11/22/2017 FINDINGS: Stable anterior compression at T5 with about 40% loss of vertebral body height, not changed from 11/22/2017. Upper thoracic vertebral levels are somewhat obscured by the patient's shoulders but without a well-defined abnormality. Mild midthoracic spondylosis. Atherosclerotic calcification of the aortic arch. IMPRESSION: 1. 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 2.  Aortic Atherosclerosis (ICD10-I70.0). Electronically Signed   By: Van Clines M.D.   On: 11/22/2021 12:04   ? ?Procedures ?Procedures (including critical care time) ? ?UC Diagnoses / Final Clinical Impressions(s)   ?I have reviewed the triage vital signs and the nursing notes. ? ?Pertinent labs & imaging results that were available during my care of the patient were reviewed by me and considered in my medical decision making (see chart for details).   ? ?Final diagnoses:  ?Chronic midline thoracic back pain  ?Nausea without vomiting  ?Sinus tachycardia  ? ?Patient was discharged prior to final radiology report of x-ray but per my personal read, patient was advised that she seems to have some disc compression between T7-T8 and T8 and T9.  I agreed that T4 looked well-healed but I also appreciated some loss of disc base between T3 and T4.  I believe that what I saw on x-ray correlated with the pain she was describing.  Patient was agreeable to a ketorolac injection today for immediate pain relief.  Patient also provided with a prescription for baclofen in the event that muscle spasm secondary to pain is causing some of the disc compression and malalignment of her spine.  Patient advised to continue diclofenac tablets as she has previously been prescribed, prescription renewed.  Patient also provided with diclofenac gel for topical pain relief which may help reduce the amount of oral NSAID she has to take.  Return precautions  advised. ?ED Prescriptions   ? ? Medication Sig Dispense Auth. Provider  ? diclofenac Sodium (VOLTAREN) 1 % GEL Apply 4 g topically 4 (four) times daily. Apply to affected areas 4 times daily as needed for pain. [place]

## 2022-08-22 ENCOUNTER — Other Ambulatory Visit: Payer: Self-pay | Admitting: Obstetrics & Gynecology

## 2022-08-22 DIAGNOSIS — Z1231 Encounter for screening mammogram for malignant neoplasm of breast: Secondary | ICD-10-CM

## 2023-08-01 IMAGING — DX DG THORACIC SPINE 2V
2 series · 2 of 2 positions shown · non-contrast
Comparison: Multiple exams, including 06/15/2016 and radiographs
from 11/22/2017

CLINICAL DATA: Remote T5 compression fracture, upper thoracic pain.

EXAM:
THORACIC SPINE 2 VIEWS

[thoracic spine ap]
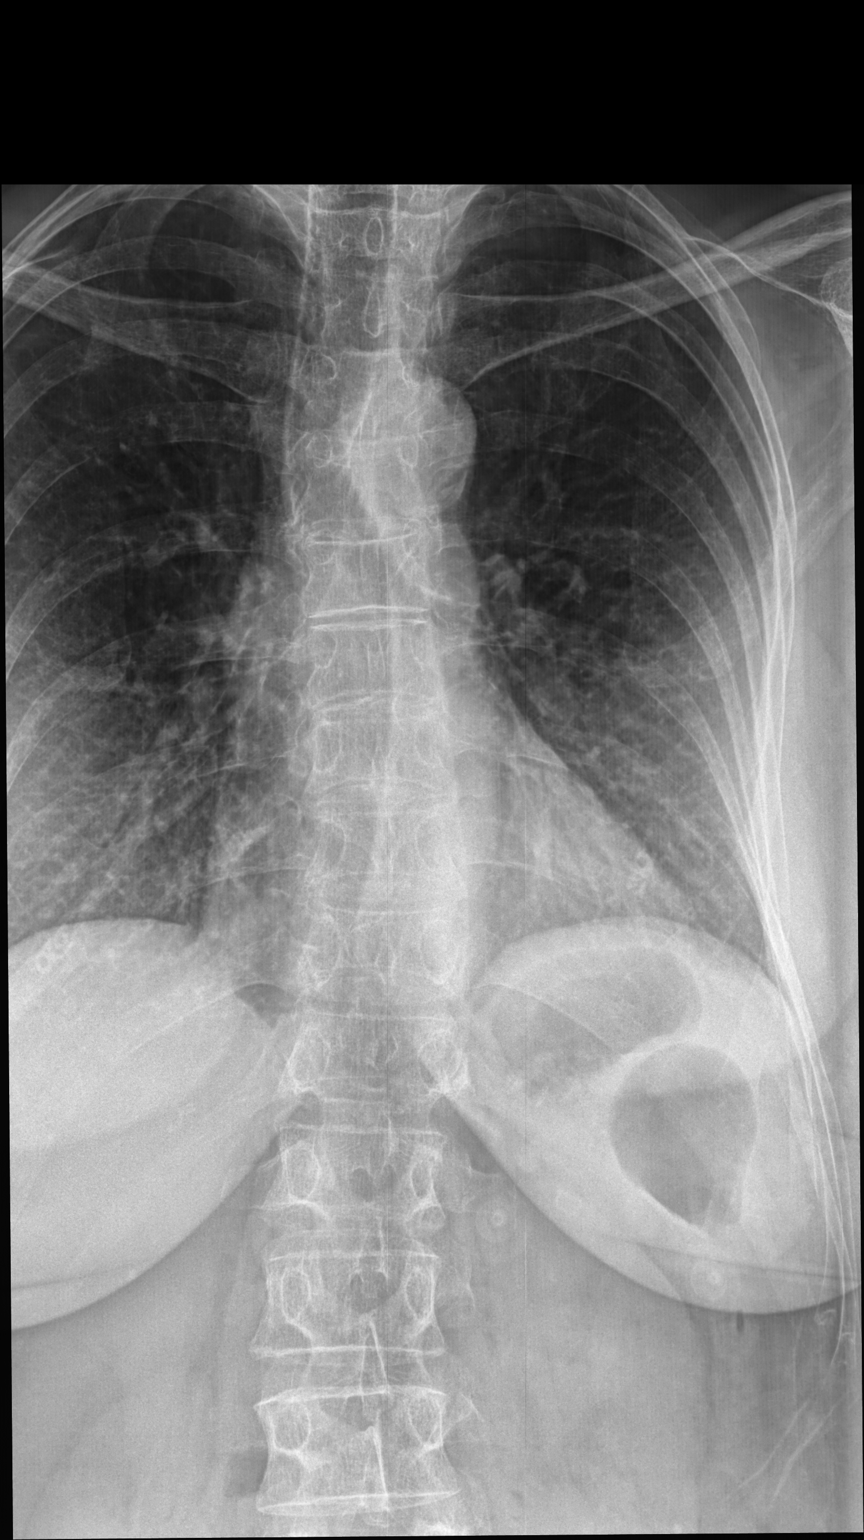

[thoracic spine standing lat]
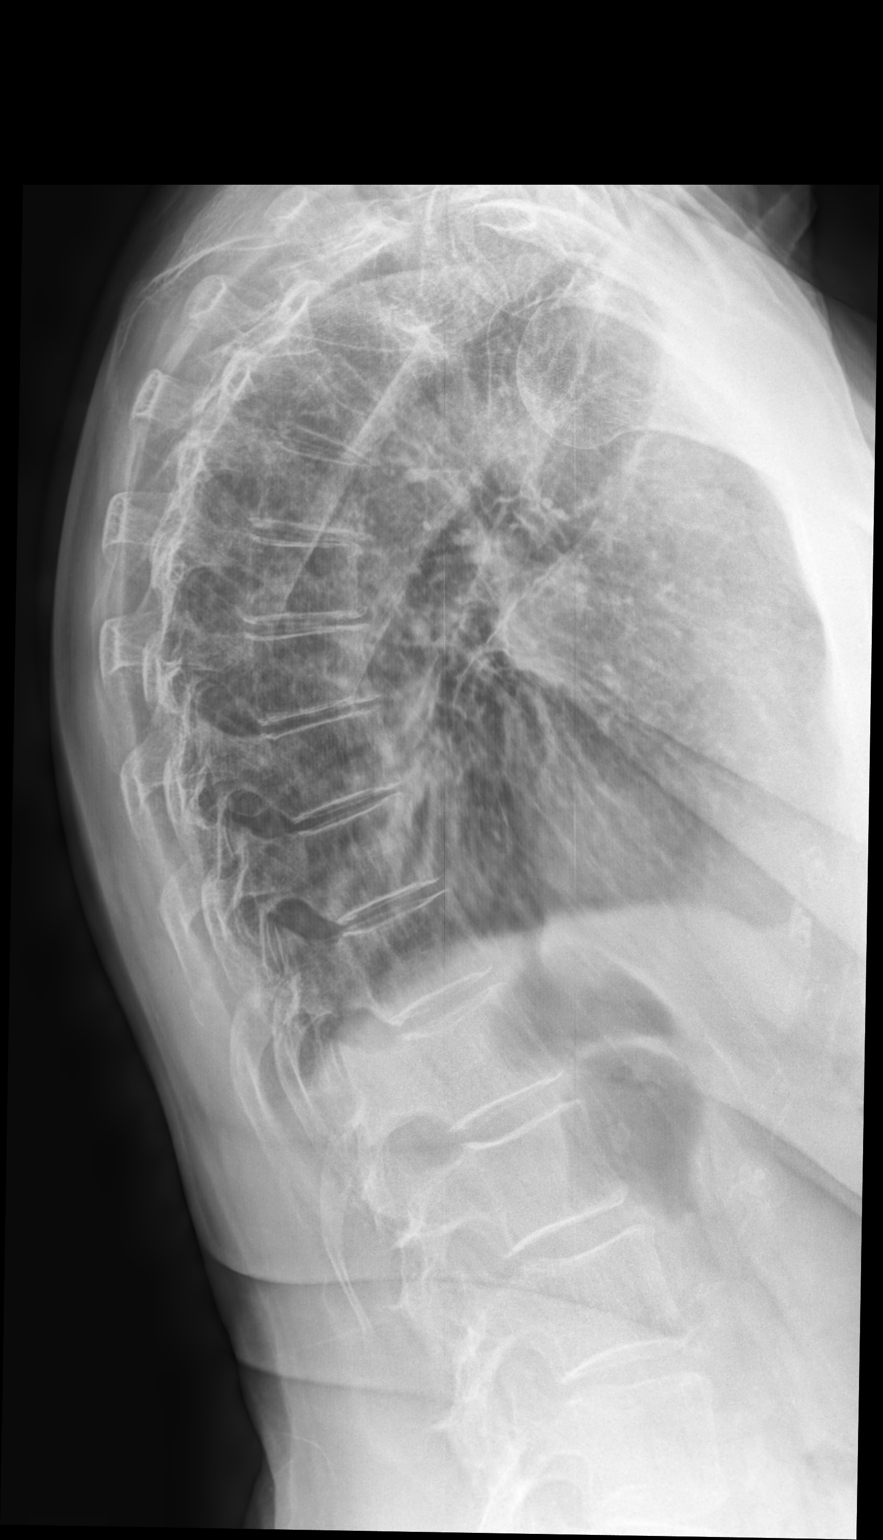

[2 of 2 positions shown; findings below may reference images not displayed]

FINDINGS: Stable anterior compression at T5 with about 40% loss of vertebral
body height, not changed from 11/22/2017. Upper thoracic vertebral
levels are somewhat obscured by the patient's shoulders but without
a well-defined abnormality. Mild midthoracic spondylosis.

Atherosclerotic calcification of the aortic arch.
IMPRESSION: 1. Stable chronic 40% compression fracture at T5.
2.  Aortic Atherosclerosis (OYNEW-SL0.0).

## 2023-09-02 ENCOUNTER — Ambulatory Visit (INDEPENDENT_AMBULATORY_CARE_PROVIDER_SITE_OTHER): Payer: 59

## 2023-09-02 ENCOUNTER — Ambulatory Visit
Admission: EM | Admit: 2023-09-02 | Discharge: 2023-09-02 | Disposition: A | Discharge status: Home / Self Care | Payer: 59 | Attending: Family Medicine | Admitting: Family Medicine

## 2023-09-02 DIAGNOSIS — J22 Unspecified acute lower respiratory infection: Secondary | ICD-10-CM | POA: Diagnosis not present

## 2023-09-02 DIAGNOSIS — R0602 Shortness of breath: Secondary | ICD-10-CM | POA: Diagnosis not present

## 2023-09-02 MED ORDER — PREDNISONE 20 MG PO TABS: 20.0000 mg | ORAL_TABLET | Freq: Every day | ORAL | 0 refills | Status: DC

## 2023-09-02 MED ORDER — AZITHROMYCIN 250 MG PO TABS: ORAL_TABLET | ORAL | 0 refills | Status: DC

## 2023-09-02 NOTE — Discharge Instructions (Addendum)
Chest x-ray is negative for pneumonia or any other abnormalities.   Treating for an acute lower respiratory illness.  Start azithromycin is an antibiotic that we will follow-up any infection. 20 mg twice daily this will help improve your work of breathing. If your breathing or any other symptoms worsen go immediately to the nearest emergency department for further workup and evaluation.

## 2023-09-02 NOTE — ED Provider Notes (Incomplete)
[facility]    CSN: [id number] Arrival date & time: 09/02/23  1656      History   Chief Complaint Chief Complaint  Patient presents with   Cough    HPI DONYALE FALCON is a 63 y.o. female.    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      Home Medications    Prior to Admission medications   Medication Sig Start Date End Date Taking? Authorizing Provider  brimonidine-timolol (COMBIGAN) 0.2-0.5 % ophthalmic solution brimonidine 0.2 %-timolol 0.5 % eye drops  INSTILL 1 DROP IN BOTH EYES TWICE DAILY Patient not taking: Reported on 09/02/2023    [provider]  rosuvastatin (CRESTOR) 10 MG tablet rosuvastatin 10 mg tablet  TAKE 1 TABLET BY MOUTH AT BEDTIME FOR CHOLESTEROL    [provider]    Family History Family History  Problem Relation Age of Onset   Ulcers Father    Gallbladder disease Mother    Hypertension Mother    Cancer Paternal Grandmother        lung    Social History Social History   Tobacco Use   Smoking status: Former    Current packs/day: 0.00    Types: Cigarettes    Quit date: 08/22/1982    Years since quitting: 41.0   Smokeless tobacco: Never  Substance Use Topics   Alcohol use: Yes     Comment: occasional   Drug use: No     Allergies   Patient has no known allergies.   Review of Systems Review of Systems  Respiratory:  Positive for cough.      Physical Exam Triage Vital Signs ED Triage Vitals [09/02/23 1821]  Encounter Vitals Group     BP 128/82     Systolic BP Percentile      Diastolic BP Percentile      Pulse Rate 93     Resp 16     Temp 98.2 F (36.8 C)     Temp Source Oral     SpO2 95 %     Weight 130 lb (59 kg)     Height 5\' 4"  (1.626 m)     Head Circumference      Peak Flow      Pain Score 0     Pain Loc      Pain Education      Exclude from Growth Chart    No data found.  Updated Vital Signs BP 128/82 (BP Location: Right Arm)   Pulse 93   Temp 98.2 F (36.8 C) (Oral)   Resp 16   Ht 5\' 4"  (1.626 m)   Wt 130 lb (59 kg)  SpO2 95%   BMI 22.31 kg/m   Visual Acuity Right Eye Distance:   Left Eye Distance:   Bilateral Distance:    Right Eye Near:   Left Eye Near:    Bilateral Near:     Physical Exam   UC Treatments / Results  Labs (all labs ordered are listed, but only abnormal results are displayed) Labs Reviewed - No data to display  EKG   Radiology No results found.  Procedures Procedures (including critical care time)  Medications Ordered in UC Medications - No data to display  Initial Impression / Assessment and Plan / UC Course  I have reviewed the triage vital signs and the nursing notes.  Pertinent labs & imaging results that were available during my care of the patient were reviewed by me and considered in my medical decision making (see chart for details).     *** Final Clinical Impressions(s) / UC Diagnoses   Final diagnoses:  None   Discharge Instructions   None    ED Prescriptions   None    PDMP not reviewed this encounter.

## 2023-09-02 NOTE — ED Triage Notes (Signed)
Patient here today with c/o headaches and fatigue X 2 weeks. Patient states that last Thursday she wasn't able to get out of bed for 3 days. Today she started to have a dry cough, still no energy, and her "chest feels full" She has also noticed that she has had some constipation. Patient took a laxative and was able to have a BM. Patient states that she feels like she is not breathing quite right.

## 2023-09-09 ENCOUNTER — Other Ambulatory Visit: Payer: Self-pay | Admitting: Obstetrics & Gynecology

## 2023-09-09 DIAGNOSIS — Z1231 Encounter for screening mammogram for malignant neoplasm of breast: Secondary | ICD-10-CM

## 2023-10-17 ENCOUNTER — Ambulatory Visit: Payer: 59

## 2023-10-17 ENCOUNTER — Ambulatory Visit
Admission: RE | Admit: 2023-10-17 | Discharge: 2023-10-17 | Disposition: A | Discharge status: Home / Self Care | Payer: 59 | Source: Ambulatory Visit | Attending: Obstetrics & Gynecology | Admitting: Obstetrics & Gynecology

## 2023-10-17 DIAGNOSIS — Z1231 Encounter for screening mammogram for malignant neoplasm of breast: Secondary | ICD-10-CM

## 2024-05-31 ENCOUNTER — Ambulatory Visit
Admission: EM | Admit: 2024-05-31 | Discharge: 2024-05-31 | Disposition: A | Discharge status: Home / Self Care | Attending: Family Medicine | Admitting: Family Medicine

## 2024-05-31 ENCOUNTER — Ambulatory Visit (INDEPENDENT_AMBULATORY_CARE_PROVIDER_SITE_OTHER)

## 2024-05-31 ENCOUNTER — Ambulatory Visit: Payer: Self-pay | Admitting: Nurse Practitioner

## 2024-05-31 DIAGNOSIS — J208 Acute bronchitis due to other specified organisms: Secondary | ICD-10-CM | POA: Diagnosis not present

## 2024-05-31 DIAGNOSIS — R051 Acute cough: Secondary | ICD-10-CM

## 2024-05-31 MED ORDER — PREDNISONE 20 MG PO TABS: 40.0000 mg | ORAL_TABLET | Freq: Every day | ORAL | 0 refills | Status: AC

## 2024-05-31 MED ORDER — BENZONATATE 100 MG PO CAPS: 100.0000 mg | ORAL_CAPSULE | Freq: Three times a day (TID) | ORAL | 0 refills | Status: DC

## 2024-05-31 NOTE — ED Provider Notes (Signed)
 [facility]    CSN: [id number] Arrival date & time: 05/31/24  1007      History   Chief Complaint Chief Complaint  Patient presents with   Cough    HPI Crystal Mills is a 63 y.o. female  presents for evaluation of URI symptoms for 7 days. Patient reports associated symptoms of cough with chest pain with coughing, throat congestion, nasal congestion, body aches/fatigue. Denies N/V/D, fevers, ear pain, shortness of breath. Patient does not have a hx of asthma. Patient is not an active smoker.  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   Reports she was around her granddaughter who has pneumonia.  Pt has taken deep just inside cough medicine OTC for symptoms. 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      Home Medications    Prior to Admission medications   Medication Sig Start Date End Date Taking? Authorizing Provider  benzonatate (TESSALON) 100 MG capsule Take 1 capsule (100 mg total) by mouth every 8 (eight) hours. 05/31/24  Yes Keon Pender, Jodi R, NP  predniSONE  (DELTASONE ) 20 MG tablet  Take 2 tablets (40 mg total) by mouth daily with breakfast for 5 days. 05/31/24 06/05/24 Yes Loreda Myla SAUNDERS, NP  azithromycin  (ZITHROMAX ) 250 MG tablet Take 2 tabs PO x 1 dose, then 1 tab PO QD x 4 days 09/02/23   Arloa Suzen RAMAN, NP  rosuvastatin (CRESTOR) 10 MG tablet rosuvastatin 10 mg tablet  TAKE 1 TABLET BY MOUTH AT BEDTIME FOR CHOLESTEROL    [provider]    Family History Family History  Problem Relation Age of Onset   Ulcers Father    Gallbladder disease Mother    Hypertension Mother    Cancer Paternal Grandmother        lung    Social History Social History   Tobacco Use   Smoking status: Former    Current packs/day: 0.00    Types: Cigarettes    Quit date: 08/22/1982    Years since quitting: 41.8   Smokeless tobacco: Never  Substance Use Topics   Alcohol use: Yes    Comment: occasional   Drug use: No     Allergies   Patient has no known allergies.   Review of Systems Review of Systems  Constitutional:  Positive for fatigue.  HENT:  Positive for congestion.   Respiratory:  Positive for cough.   Musculoskeletal:  Positive for myalgias.     Physical Exam Triage  Vital Signs ED Triage Vitals  Encounter Vitals Group     BP 05/31/24 1033 127/86     Girls Systolic BP Percentile --      Girls Diastolic BP Percentile --      Boys Systolic BP Percentile --      Boys Diastolic BP Percentile --      Pulse Rate 05/31/24 1033 98     Resp 05/31/24 1033 18     Temp 05/31/24 1033 99.2 F (37.3 C)     Temp Source 05/31/24 1033 Oral     SpO2 05/31/24 1033 98 %     Weight --      Height --      Head Circumference --      Peak Flow --      Pain Score 05/31/24 1032 0     Pain Loc --      Pain Education --      Exclude from Growth Chart --    No data found.  Updated Vital Signs BP 127/86 (BP Location: Right Arm)   Pulse 98   Temp 99.2 F (37.3 C) (Oral)   Resp 18   SpO2 98%   Visual Acuity Right Eye Distance:   Left Eye Distance:    Bilateral Distance:    Right Eye Near:   Left Eye Near:    Bilateral Near:     Physical Exam Vitals and nursing note reviewed.  Constitutional:      General: She is not in acute distress.    Appearance: She is well-developed. She is not ill-appearing.  HENT:     Head: Normocephalic and atraumatic.     Right Ear: Tympanic membrane and ear canal normal.     Left Ear: Tympanic membrane and ear canal normal.     Nose: Congestion present.     Mouth/Throat:     Mouth: Mucous membranes are moist.     Pharynx: Oropharynx is clear. Uvula midline. No posterior oropharyngeal erythema.     Tonsils: No tonsillar exudate or tonsillar abscesses.  Eyes:     Conjunctiva/sclera: Conjunctivae normal.     Pupils: Pupils are equal, round, and reactive to light.  Cardiovascular:     Rate and Rhythm: Normal rate and regular rhythm.     Heart sounds: Normal heart sounds.  Pulmonary:     Effort: Pulmonary effort is normal.     Breath sounds: Normal breath sounds. No wheezing or rhonchi.  Musculoskeletal:     Cervical back: Normal range of motion and neck supple.  Lymphadenopathy:     Cervical: No cervical adenopathy.  Skin:    General: Skin is warm and dry.  Neurological:     General: No focal deficit present.     Mental Status: She is alert and oriented to person, place, and time.  Psychiatric:        Mood and Affect: Mood normal.        Behavior: Behavior normal.      UC Treatments / Results  Labs (all labs ordered are listed, but only abnormal results are displayed) Labs Reviewed - No data to display  EKG   Radiology DG Chest 2 View Result Date: 05/31/2024 EXAM: 2 VIEW(S) XRAY OF THE CHEST 05/31/2024 10:51:58 AM COMPARISON: 09/02/2023 CLINICAL HISTORY: Cough x 1 week, PNA exposure FINDINGS: LUNGS AND PLEURA: No focal pulmonary opacity. No pulmonary edema. No pleural effusion. No pneumothorax. HEART AND MEDIASTINUM: No acute abnormality of the cardiac and mediastinal silhouettes.  BONES AND  SOFT TISSUES: No acute osseous abnormality. IMPRESSION: 1. No acute cardiopulmonary process. Electronically signed by: Norleen Kil MD 05/31/2024 11:02 AM EST RP Workstation: [id number]    Procedures Procedures (including critical care time)  Medications Ordered in UC Medications - No data to display  Initial Impression / Assessment and Plan / UC Course  I have reviewed the triage vital signs and the nursing notes.  Pertinent labs & imaging results that were available during my care of the patient were reviewed by me and considered in my medical decision making (see chart for details).     Reviewed exam and symptom with patient.  No red flags.  X-ray negative for pneumonia.  Discussed viral bronchitis.  Will do Tessalon and prednisone .  Encouraged rest fluids and PCP follow-up 2 to 3 days for recheck.  ER precautions reviewed Final Clinical Impressions(s) / UC Diagnoses   Final diagnoses:  Acute cough  Viral bronchitis     Discharge Instructions      You may take Tessalon 3 times a day as needed for your cough.  Start prednisone  daily for 5 days.  Lots of rest and fluids.  Please follow-up with your PCP in 2 to 3 days for recheck.  Please go to the ER if you develop any worsening symptoms.  Hope you feel better soon exclamation     ED Prescriptions     Medication Sig Dispense Auth. Provider   benzonatate (TESSALON) 100 MG capsule Take 1 capsule (100 mg total) by mouth every 8 (eight) hours. 21 capsule Koki Buxton, Jodi R, NP   predniSONE  (DELTASONE ) 20 MG tablet Take 2 tablets (40 mg total) by mouth daily with breakfast for 5 days. 10 tablet Ramey [place]cher[place], Jodi R, NP      PDMP not reviewed this encounter.   Loreda Myla SAUNDERS, NP 05/31/24 1110

## 2024-05-31 NOTE — ED Triage Notes (Signed)
 Pt reports cough, chest pain when coughing sore throat feel full, nasal congestion on and off x 1 week. OTC meds gives some relief. 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

## 2024-05-31 NOTE — Discharge Instructions (Addendum)
 You may take Tessalon 3 times a day as needed for your cough.  Start prednisone  daily for 5 days.  Lots of rest and fluids.  Please follow-up with your PCP in 2 to 3 days for recheck.  Please go to the ER if you develop any worsening symptoms.  Hope you feel better soon exclamation

## 2024-06-12 ENCOUNTER — Other Ambulatory Visit: Payer: Self-pay

## 2024-06-12 ENCOUNTER — Ambulatory Visit (INDEPENDENT_AMBULATORY_CARE_PROVIDER_SITE_OTHER)

## 2024-06-12 ENCOUNTER — Ambulatory Visit
Admission: RE | Admit: 2024-06-12 | Discharge: 2024-06-12 | Disposition: A | Discharge status: Home / Self Care | Source: Ambulatory Visit | Attending: Family Medicine | Admitting: Family Medicine

## 2024-06-12 VITALS — BP 115/75 | HR 88 | Temp 98.8°F | Resp 16

## 2024-06-12 DIAGNOSIS — J208 Acute bronchitis due to other specified organisms: Secondary | ICD-10-CM | POA: Diagnosis not present

## 2024-06-12 DIAGNOSIS — R052 Subacute cough: Secondary | ICD-10-CM | POA: Diagnosis not present

## 2024-06-12 MED ORDER — HYDROCOD POLI-CHLORPHE POLI ER 10-8 MG/5ML PO SUER: 5.0000 mL | Freq: Two times a day (BID) | ORAL | 0 refills | Status: AC | PRN

## 2024-06-12 NOTE — Discharge Instructions (Signed)
 You were seen today for continued cough.  Your chest xray was again normal.  I have sent out a cough syrup to see if more helpful for your cough.  Please return if not improving or worsening.

## 2024-06-12 NOTE — ED Triage Notes (Signed)
 Pt states still has dry cough, that her cough has not gone away despite taking meds prescribed. Pt c/o right upper back painx4d. PT states it's difficult to get up out of bed from the back pain.

## 2024-06-12 NOTE — ED Provider Notes (Signed)
 [facility]    CSN: [id number] Arrival date & time: 06/12/24  0801      History   Chief Complaint Chief Complaint  Patient presents with   Follow-up    Still coughing and pain on right side of back - Entered by patient    HPI [place] is a 63 y.o. female.   Patient is here for a cough x 3 weeks.  She as seen here on 11/2, dx with viral URI.  Given steroid and tessalon.  She continues with cough.  No fevers.  No wheezing or sob per se, but the cough just won't stop.  She is now having pain at the right upper back.  Not sure if she pulled a muscle or something else.  Pain with taking a deep breath.        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      Home Medications    Prior to Admission medications   Medication Sig Start Date End Date Taking? Authorizing Provider  azithromycin  (ZITHROMAX ) 250 MG tablet Take 2 tabs PO x 1 dose, then 1 tab PO QD x 4 days 09/02/23   Arloa Suzen RAMAN, NP  benzonatate (TESSALON) 100 MG capsule Take 1 capsule (100 mg total) by mouth every 8 (eight) hours. 05/31/24   Mayer, Jodi R, NP  rosuvastatin (CRESTOR) 10 MG tablet rosuvastatin 10 mg tablet  TAKE 1 TABLET BY MOUTH AT BEDTIME FOR  CHOLESTEROL    [provider]    Family History Family History  Problem Relation Age of Onset   Ulcers Father    Gallbladder disease Mother    Hypertension Mother    Cancer Paternal Grandmother        lung    Social History Social History   Tobacco Use   Smoking status: Former    Current packs/day: 0.00    Types: Cigarettes    Quit date: 08/22/1982    Years since quitting: 41.8   Smokeless tobacco: Never  Substance Use Topics   Alcohol use: Not Currently    Comment: occasional   Drug use: No     Allergies   Patient has no known allergies.   Review of Systems Review of Systems  Constitutional: Negative.   HENT: Negative.    Respiratory:  Positive for cough. Negative for shortness of breath and wheezing.   Cardiovascular: Negative.   Gastrointestinal: Negative.   Genitourinary: Negative.   Musculoskeletal: Negative.   Psychiatric/Behavioral: Negative.       Physical Exam Triage Vital Signs ED Triage Vitals  Encounter Vitals Group     BP 06/12/24 0815 115/75     Girls Systolic  BP Percentile --      Girls Diastolic BP Percentile --      Boys Systolic BP Percentile --      Boys Diastolic BP Percentile --      Pulse Rate 06/12/24 0815 88     Resp 06/12/24 0815 16     Temp 06/12/24 0815 98.8 F (37.1 C)     Temp Source 06/12/24 0815 Oral     SpO2 06/12/24 0815 96 %     Weight --      Height --      Head Circumference --      Peak Flow --      Pain Score 06/12/24 0813 6     Pain Loc --      Pain Education --      Exclude from Growth Chart --    No data found.  Updated Vital Signs BP 115/75   Pulse 88   Temp 98.8 F (37.1 C) (Oral)   Resp 16   SpO2 96%   Visual Acuity Right Eye Distance:   Left Eye Distance:   Bilateral Distance:    Right Eye Near:   Left Eye Near:    Bilateral Near:     Physical Exam Constitutional:      General: She is not in acute distress.    Appearance: Normal appearance. She is normal weight. She is  not ill-appearing or toxic-appearing.  HENT:     Nose: Nose normal.     Mouth/Throat:     Mouth: Mucous membranes are moist.  Cardiovascular:     Rate and Rhythm: Normal rate and regular rhythm.  Pulmonary:     Effort: Pulmonary effort is normal.     Breath sounds: Normal breath sounds.  Musculoskeletal:     Cervical back: Normal range of motion and neck supple.     Comments: No TTP to the right upper/lower back   Skin:    General: Skin is warm.  Neurological:     General: No focal deficit present.     Mental Status: She is alert.  Psychiatric:        Mood and Affect: Mood normal.      UC Treatments / Results  Labs (all labs ordered are listed, but only abnormal results are displayed) Labs Reviewed - No data to display  EKG   Radiology DG Chest 2 View Result Date: 06/12/2024 CLINICAL DATA:  Cough for 3 weeks. EXAM: CHEST - 2 VIEW COMPARISON:  05/31/2024 FINDINGS: The heart size and mediastinal contours are within normal limits. Stable appearance suggesting some degree of underlying emphysematous lung disease. There is no evidence of pulmonary edema, consolidation, pneumothorax, nodule or pleural fluid. The visualized skeletal structures are unremarkable. IMPRESSION: No active cardiopulmonary disease. Stable appearance suggesting some degree of underlying emphysematous lung disease. Electronically Signed   By: Marcey Moan M.D.   On: 06/12/2024 08:34    Procedures Procedures (including critical care time)  Medications Ordered in UC Medications - No data to display  Initial Impression / Assessment and Plan / UC Course  I have reviewed the triage vital signs and the nursing notes.  Pertinent labs & imaging results that were available during my care of the patient were reviewed by me and considered in my medical decision making (see chart for details).   Final Clinical Impressions(s) / UC Diagnoses   Final diagnoses:  Subacute cough  Viral bronchitis      Discharge Instructions      You  were seen today for continued cough.  Your chest xray was again normal.  I have sent out a cough syrup to see if more helpful for your cough.  Please return if not improving or worsening.     ED Prescriptions     Medication Sig Dispense Auth. Provider   chlorpheniramine-HYDROcodone (TUSSIONEX) 10-8 MG/5ML Take 5 mLs by mouth every 12 (twelve) hours as needed for cough. 50 mL Darral Longs, MD      PDMP not reviewed this encounter.   Darral Longs, MD 06/12/24 [phone]
